# Patient Record
Sex: Male | Born: 1975 | Race: Black or African American | Hispanic: No | Marital: Single | Smoking: Never smoker
Health system: Southern US, Community
[De-identification: ages and names within clinical notes are randomized; demographics above are authoritative.]

---

## 2017-08-04 ENCOUNTER — Encounter (HOSPITAL_COMMUNITY): Payer: Self-pay | Admitting: Emergency Medicine

## 2017-08-04 DIAGNOSIS — Y9355 Activity, bike riding: Secondary | ICD-10-CM | POA: Diagnosis not present

## 2017-08-04 DIAGNOSIS — Y999 Unspecified external cause status: Secondary | ICD-10-CM | POA: Diagnosis not present

## 2017-08-04 DIAGNOSIS — S161XXA Strain of muscle, fascia and tendon at neck level, initial encounter: Secondary | ICD-10-CM | POA: Diagnosis not present

## 2017-08-04 DIAGNOSIS — S0990XA Unspecified injury of head, initial encounter: Secondary | ICD-10-CM | POA: Diagnosis not present

## 2017-08-04 DIAGNOSIS — S20219A Contusion of unspecified front wall of thorax, initial encounter: Secondary | ICD-10-CM | POA: Insufficient documentation

## 2017-08-04 DIAGNOSIS — S20309A Unspecified superficial injuries of unspecified front wall of thorax, initial encounter: Secondary | ICD-10-CM | POA: Diagnosis present

## 2017-08-04 DIAGNOSIS — Y929 Unspecified place or not applicable: Secondary | ICD-10-CM | POA: Insufficient documentation
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
## 2017-08-05 ENCOUNTER — Emergency Department (HOSPITAL_COMMUNITY): Payer: No Typology Code available for payment source

## 2017-08-05 ENCOUNTER — Emergency Department (HOSPITAL_COMMUNITY)
Admission: EM | Admit: 2017-08-05 | Discharge: 2017-08-05 | Disposition: A | Discharge status: Home / Self Care | Payer: No Typology Code available for payment source | Attending: Emergency Medicine | Admitting: Emergency Medicine

## 2017-08-05 DIAGNOSIS — T07XXXA Unspecified multiple injuries, initial encounter: Secondary | ICD-10-CM

## 2017-08-05 DIAGNOSIS — S161XXA Strain of muscle, fascia and tendon at neck level, initial encounter: Secondary | ICD-10-CM

## 2017-08-05 DIAGNOSIS — S0990XA Unspecified injury of head, initial encounter: Secondary | ICD-10-CM

## 2017-08-05 MED ORDER — CYCLOBENZAPRINE HCL 10 MG PO TABS: 10.0000 mg | ORAL_TABLET | Freq: Three times a day (TID) | ORAL | 0 refills | Status: AC | PRN

## 2017-08-05 MED ORDER — ACETAMINOPHEN 500 MG PO TABS
1000.0000 mg | ORAL_TABLET | Freq: Once | ORAL | Status: AC
  Administered 2017-08-05: 1000 mg via ORAL
  Filled 2017-08-05: qty 2

## 2017-08-05 MED ORDER — IBUPROFEN 800 MG PO TABS: 800.0000 mg | ORAL_TABLET | Freq: Three times a day (TID) | ORAL | 0 refills | Status: AC

## 2017-08-05 NOTE — ED Notes (Signed)
Patient transported to CT 

## 2017-08-05 NOTE — ED Notes (Signed)
Called  No response from lobby 

## 2017-08-05 NOTE — ED Notes (Signed)
Dr. Blinda Leatherwood made aware of patient's fever-no further orders except for tylenol-administered. Explained to patient to follow up as needed, if fever persists or worsens, return to the ED for further evaluation. Patient given prescriptions for ibuprofen and flexeril.
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
